# Patient Record
Sex: Male | Born: 1965 | Race: Black or African American | Hispanic: No | Marital: Single | Smoking: Never smoker
Health system: Southern US, Community
[De-identification: ages and names within clinical notes are randomized; demographics above are authoritative.]

## PROBLEM LIST (undated history)

## (undated) DIAGNOSIS — M109 Gout, unspecified: Secondary | ICD-10-CM

---

## 2016-03-27 ENCOUNTER — Emergency Department (HOSPITAL_COMMUNITY)
Admission: EM | Admit: 2016-03-27 | Discharge: 2016-03-27 | Disposition: A | Discharge status: Home / Self Care | Payer: Federal, State, Local not specified - PPO | Attending: Emergency Medicine | Admitting: Emergency Medicine

## 2016-03-27 ENCOUNTER — Emergency Department (HOSPITAL_COMMUNITY): Payer: Federal, State, Local not specified - PPO

## 2016-03-27 ENCOUNTER — Encounter (HOSPITAL_COMMUNITY): Payer: Self-pay | Admitting: Emergency Medicine

## 2016-03-27 DIAGNOSIS — Z79899 Other long term (current) drug therapy: Secondary | ICD-10-CM | POA: Diagnosis not present

## 2016-03-27 DIAGNOSIS — M7989 Other specified soft tissue disorders: Secondary | ICD-10-CM | POA: Diagnosis present

## 2016-03-27 DIAGNOSIS — M109 Gout, unspecified: Secondary | ICD-10-CM

## 2016-03-27 LAB — CBG MONITORING, ED: GLUCOSE-CAPILLARY: 99 mg/dL (ref 65–99)

## 2016-03-27 MED ORDER — CEPHALEXIN 500 MG PO CAPS: 500.0000 mg | ORAL_CAPSULE | Freq: Four times a day (QID) | ORAL | 0 refills | Status: DC

## 2016-03-27 MED ORDER — COLCHICINE 0.6 MG PO TABS: 0.6000 mg | ORAL_TABLET | Freq: Every day | ORAL | 0 refills | Status: DC

## 2016-03-27 MED ORDER — IBUPROFEN 200 MG PO TABS
600.0000 mg | ORAL_TABLET | Freq: Once | ORAL | Status: AC
  Administered 2016-03-27: 600 mg via ORAL
  Filled 2016-03-27: qty 3

## 2016-03-27 MED ORDER — OXYCODONE-ACETAMINOPHEN 5-325 MG PO TABS: 1.0000 | ORAL_TABLET | ORAL | 0 refills | Status: DC | PRN

## 2016-03-27 NOTE — ED Provider Notes (Signed)
[facility]-EMERGENCY DEPT Provider Note   CSN: [id number] Arrival date & time: 03/27/16  1641   By signing my name below, I, Clovis Pu, attest that this documentation has been prepared under the direction and in the presence of  [facility], PA-C. Electronically Signed: Clovis Pu, ED Scribe. 03/27/16. 5:36 PM.   History   Chief Complaint Chief Complaint  Patient presents with  . Abscess    right foot   The history is provided by the patient. No language interpreter was used.   HPI Comments:  Darvell Monteforte is a 51 y.o. male who presents to the Emergency Department complaining of acute onset swelling to his right big toe with associated "4/10" pain x 2 days. His pain is worse with palpation and movement. Pt also reports swelling and redness to his right foot. No alleviating factors noted. Pt denies fevers, chills, nausea, vomiting, leg swelling, body aches, any recent injury to his foot, any recent insect bites, a hx of gout and any other associated symptoms at this time. No PCP noted.   History reviewed. No pertinent past medical history.  There are no active problems to display for this patient.   History reviewed. No pertinent surgical history.   Home Medications    Prior to Admission medications   Medication Sig Start Date End Date Taking? Authorizing Provider  cephALEXin (KEFLEX) 500 MG capsule Take 1 capsule (500 mg total) by mouth 4 (four) times daily. 03/27/16   Trixie Dredge, PA-C  colchicine 0.6 MG tablet Take 1 tablet (0.6 mg total) by mouth daily. Take 1.2 mg PO once, then take 0.6mg  tab 1 hour later. 03/27/16   Trixie Dredge, PA-C  oxyCODONE-acetaminophen (PERCOCET/ROXICET) 5-325 MG tablet Take 1-2 tablets by mouth every 4 (four) hours as needed for moderate pain or severe pain. 03/27/16   Trixie Dredge, PA-C    Family History No family history on file.  Social History Social History  Substance Use Topics  . Smoking status: Never Smoker  . Smokeless tobacco: Never Used  .  Alcohol use No     Allergies   Patient has no allergy information on record.   Review of Systems Review of Systems  Constitutional: Negative for chills and fever.  Cardiovascular: Negative for leg swelling.  Gastrointestinal: Negative for nausea and vomiting.  Musculoskeletal: Positive for joint swelling. Negative for myalgias.  Skin: Positive for color change.   Physical Exam Updated Vital Signs BP 118/85   Pulse 79   Temp 98.2 F (36.8 C) (Oral)   Resp 17   SpO2 98%   Physical Exam  Constitutional: He appears well-developed and well-nourished.  HENT:  Head: Normocephalic and atraumatic.  Neck: Neck supple.  Pulmonary/Chest: Effort normal.  Musculoskeletal: He exhibits tenderness. He exhibits no edema.  Right foot: Diffuse edema and erythema overlying dorsum not including the ankle. TTP only over the 1st MTP. Slight decrease of ROM of the 1st toe. Capillary refill < 3 seconds. Sensation intact.   Neurological: He is alert.  Nursing note and vitals reviewed.      ED Treatments / Results  DIAGNOSTIC STUDIES:  Oxygen Saturation is 99% on RA, normal by my interpretation.    COORDINATION OF CARE:  5:33 PM Discussed treatment plan with pt at bedside and pt agreed to plan.  Labs (all labs ordered are listed, but only abnormal results are displayed) Labs Reviewed  CBG MONITORING, ED    EKG  EKG Interpretation None       Radiology Dg Foot Complete  Right  Result Date: 03/27/2016 CLINICAL DATA:  First toe with pain and swelling for 3 days, no known injury, initial encounter EXAM: RIGHT FOOT COMPLETE - 3+ VIEW COMPARISON:  None. FINDINGS: Mild degenerative changes are noted at the first MTP joint. No acute fracture or dislocation is noted. No gross soft tissue abnormality is seen. IMPRESSION: No acute abnormality noted. Electronically Signed   By: Alcide CleverMark  Lukens M.D.   On: 03/27/2016 18:03    Procedures Procedures (including critical care time)  Medications  Ordered in ED Medications  ibuprofen (ADVIL,MOTRIN) tablet 600 mg (600 mg Oral Given 03/27/16 1845)     Initial Impression / Assessment and Plan / ED Course  I have reviewed the triage vital signs and the nursing notes.  Pertinent labs & imaging results that were available during my care of the patient were reviewed by me and considered in my medical decision making (see chart for details).     Afebrile, nontoxic patient with right 1st MTP pain and associated edema and erythema of the foot.  Strongly suspect gout clinically.  Less likely cellulitis or septic joint. There is no fever no pain or tenderness beyond the 1st MTP though there is erythema.  Discussed differential and return precautions with patient, pt agrees to 2 day recheck in ED.   D/C home with colchicine, percocet, keflex.  Discussed result, findings, treatment, and follow up  with patient.  Pt given return precautions.  Pt verbalizes understanding and agrees with plan.       Final Clinical Impressions(s) / ED Diagnoses   Final diagnoses:  Acute gout of right foot, unspecified cause    New Prescriptions Discharge Medication List as of 03/27/2016  6:52 PM    START taking these medications   Details  cephALEXin (KEFLEX) 500 MG capsule Take 1 capsule (500 mg total) by mouth 4 (four) times daily., Starting Mon 03/27/2016, Print    colchicine 0.6 MG tablet Take 1 tablet (0.6 mg total) by mouth daily. Take 1.2 mg PO once, then take 0.6mg  tab 1 hour later., Starting Mon 03/27/2016, Print    oxyCODONE-acetaminophen (PERCOCET/ROXICET) 5-325 MG tablet Take 1-2 tablets by mouth every 4 (four) hours as needed for moderate pain or severe pain., Starting Mon 03/27/2016, Print        I personally performed the services described in this documentation, which was scribed in my presence. The recorded information has been reviewed and is accurate.     Trixie Dredgemily Deanne Bedgood, PA-C 03/27/16 1950    Trixie Dredgemily Trenice Mesa, PA-C 03/27/16 1951    Derwood KaplanAnkit  Nanavati, MD 03/28/16 [phone]

## 2016-03-27 NOTE — ED Notes (Signed)
Ortho at bedside.

## 2016-03-27 NOTE — ED Triage Notes (Signed)
Pt reports abscess on right big toe that needs to be drained.

## 2016-03-27 NOTE — Discharge Instructions (Signed)
Read the information below.  Use the prescribed medication as directed.  Please discuss all new medications with your pharmacist.  Do not take additional tylenol while taking the prescribed pain medication to avoid overdose.  You may return to the Emergency Department at any time for worsening condition or any new symptoms that concern you.     If you develop increased redness, swelling, pus draining from the wound, uncontrolled pain, or fevers greater than 100.4, return to the ER immediately for a recheck.   °

## 2016-03-29 ENCOUNTER — Emergency Department (HOSPITAL_COMMUNITY)
Admission: EM | Admit: 2016-03-29 | Discharge: 2016-03-29 | Disposition: A | Discharge status: Home / Self Care | Payer: Federal, State, Local not specified - PPO | Attending: Emergency Medicine | Admitting: Emergency Medicine

## 2016-03-29 ENCOUNTER — Encounter (HOSPITAL_COMMUNITY): Payer: Self-pay | Admitting: Emergency Medicine

## 2016-03-29 DIAGNOSIS — M109 Gout, unspecified: Secondary | ICD-10-CM

## 2016-03-29 DIAGNOSIS — Z09 Encounter for follow-up examination after completed treatment for conditions other than malignant neoplasm: Secondary | ICD-10-CM

## 2016-03-29 HISTORY — DX: Gout, unspecified: M10.9

## 2016-03-29 NOTE — ED Triage Notes (Signed)
Pt was recently seen and treated for gout (given antibiotics).  He was told to come back and follow up to make sure the medication was working.  States he thinks the medication is working and he feels a lot better.

## 2016-03-29 NOTE — Discharge Instructions (Signed)
Return here as needed.  Follow up with a primary care doctor °

## 2016-03-29 NOTE — ED Provider Notes (Signed)
[facility]ER[facility] DEPT Provider Note   CSN: [id number] Arrival date & time: 03/29/16  1855     History   Chief Complaint Chief Complaint  Patient presents with  . Follow-up    HPI Allen Eaton is a 51 y.o. male.  HPI Patient presents to the emergency department or recheck of his gout flare up.  Patient states he is told to come back here in 2 days for recheck.  The patient states he just started the medicines yesterday and now has dramatic improvement.  He states the pain is 1 out of 10.  He states that he feels dramatically improved.  Patient has no other complaints at this time Past Medical History:  Diagnosis Date  . Gout     There are no active problems to display for this patient.   History reviewed. No pertinent surgical history.     Home Medications    Prior to Admission medications   Medication Sig Start Date End Date Taking? Authorizing Provider  cephALEXin (KEFLEX) 500 MG capsule Take 1 capsule (500 mg total) by mouth 4 (four) times daily. 03/27/16   Trixie DredgeEmily West, PA-C  colchicine 0.6 MG tablet Take 1 tablet (0.6 mg total) by mouth daily. Take 1.2 mg PO once, then take 0.6mg  tab 1 hour later. 03/27/16   Trixie DredgeEmily West, PA-C  oxyCODONE-acetaminophen (PERCOCET/ROXICET) 5-325 MG tablet Take 1-2 tablets by mouth every 4 (four) hours as needed for moderate pain or severe pain. 03/27/16   Trixie DredgeEmily West, PA-C    Family History No family history on file.  Social History Social History  Substance Use Topics  . Smoking status: Never Smoker  . Smokeless tobacco: Never Used  . Alcohol use No     Allergies   Patient has no known allergies.   Review of Systems Review of Systems  All other systems negative except as documented in the HPI. All pertinent positives and negatives as reviewed in the HPI. Physical Exam Updated Vital Signs BP 146/97 (BP Location: Left Arm)   Pulse 74   Temp 98 F (36.7 C) (Oral)   Resp 18   Ht 5\' 7"  (1.702 m)   Wt 73.5 kg   SpO2 98%   BMI  25.37 kg/m   Physical Exam  Constitutional: He is oriented to person, place, and time. He appears well-developed and well-nourished.  Musculoskeletal:       Feet:  Neurological: He is alert and oriented to person, place, and time.     ED Treatments / Results  Labs (all labs ordered are listed, but only abnormal results are displayed) Labs Reviewed - No data to display  EKG  EKG Interpretation None       Radiology No results found.  Procedures Procedures (including critical care time)  Medications Ordered in ED Medications - No data to display   Initial Impression / Assessment and Plan / ED Course  I have reviewed the triage vital signs and the nursing notes.  Pertinent labs & imaging results that were available during my care of the patient were reviewed by me and considered in my medical decision making (see chart for details).     Patient is advised to return here as needed.  Told to follow up with primary care doctor   Final Clinical Impressions(s) / ED Diagnoses   Final diagnoses:  None    New Prescriptions New Prescriptions   No medications on file     Charlestine NightChristopher Shanee Batch, PA-C 03/29/16 2106    Lyndal Pulleyaniel Knott, MD  03/30/16 0359  

## 2017-12-19 IMAGING — CR DG FOOT COMPLETE 3+V*R*
3 series · 3 of 3 positions shown · non-contrast
Comparison: None.

CLINICAL DATA: First toe with pain and swelling for 3 days, no
known injury, initial encounter

EXAM:
RIGHT FOOT COMPLETE - 3+ VIEW

[x foot ap right]
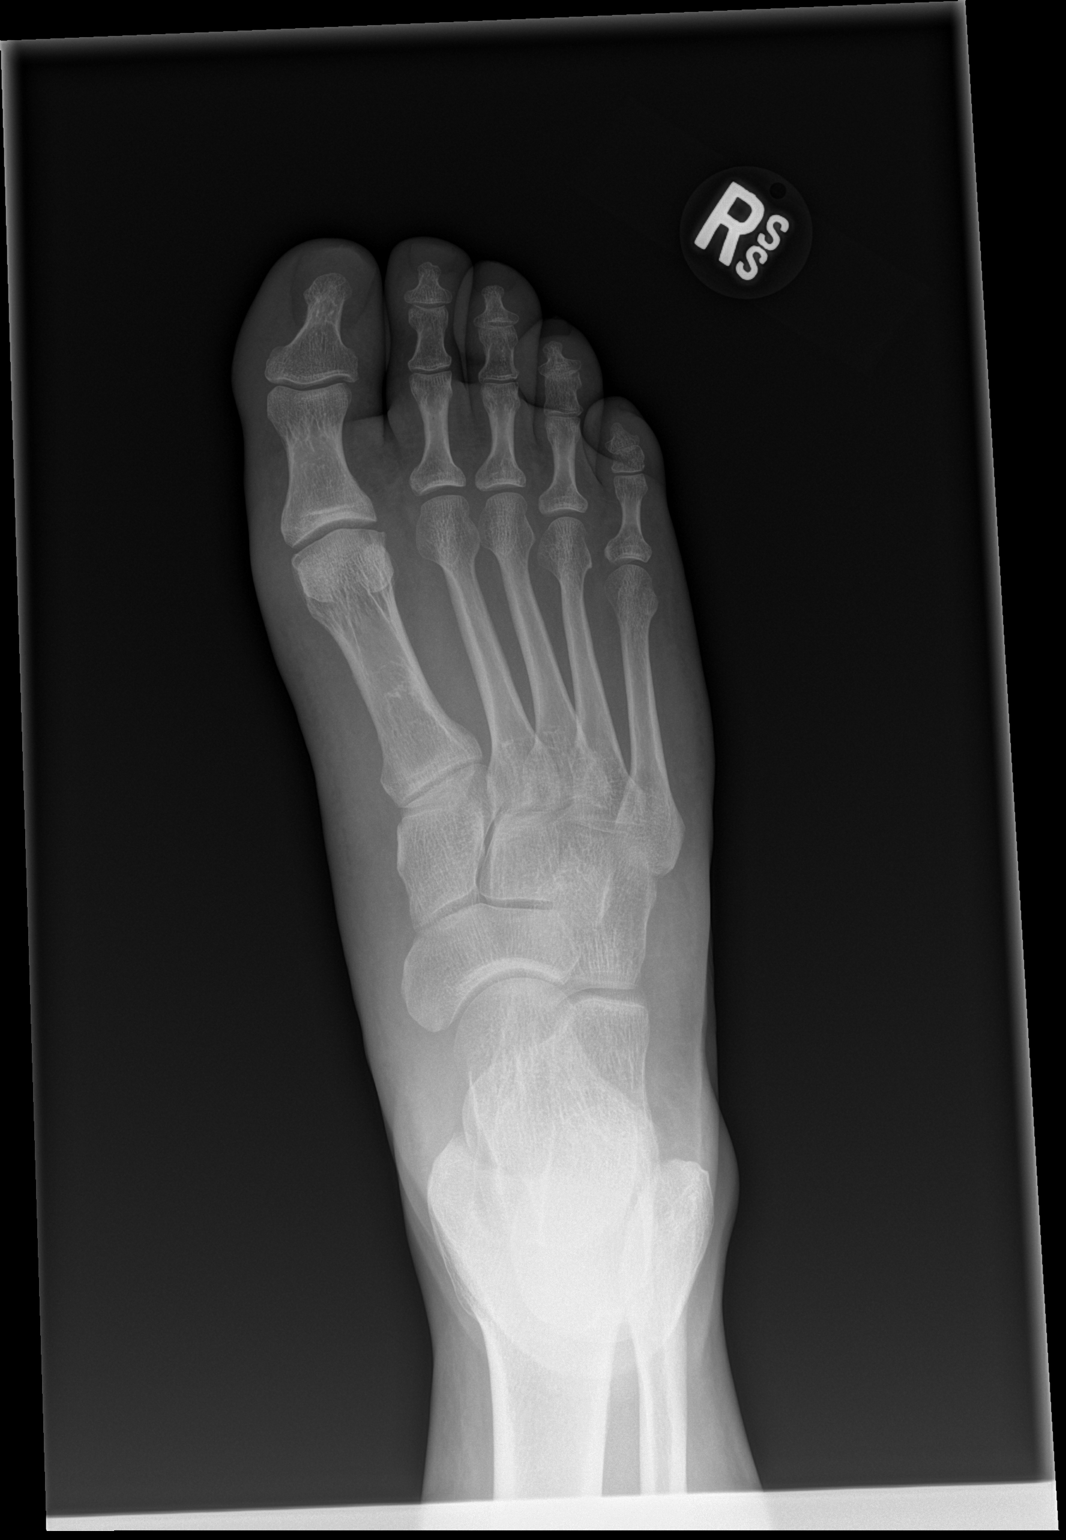

[x foot obl right]
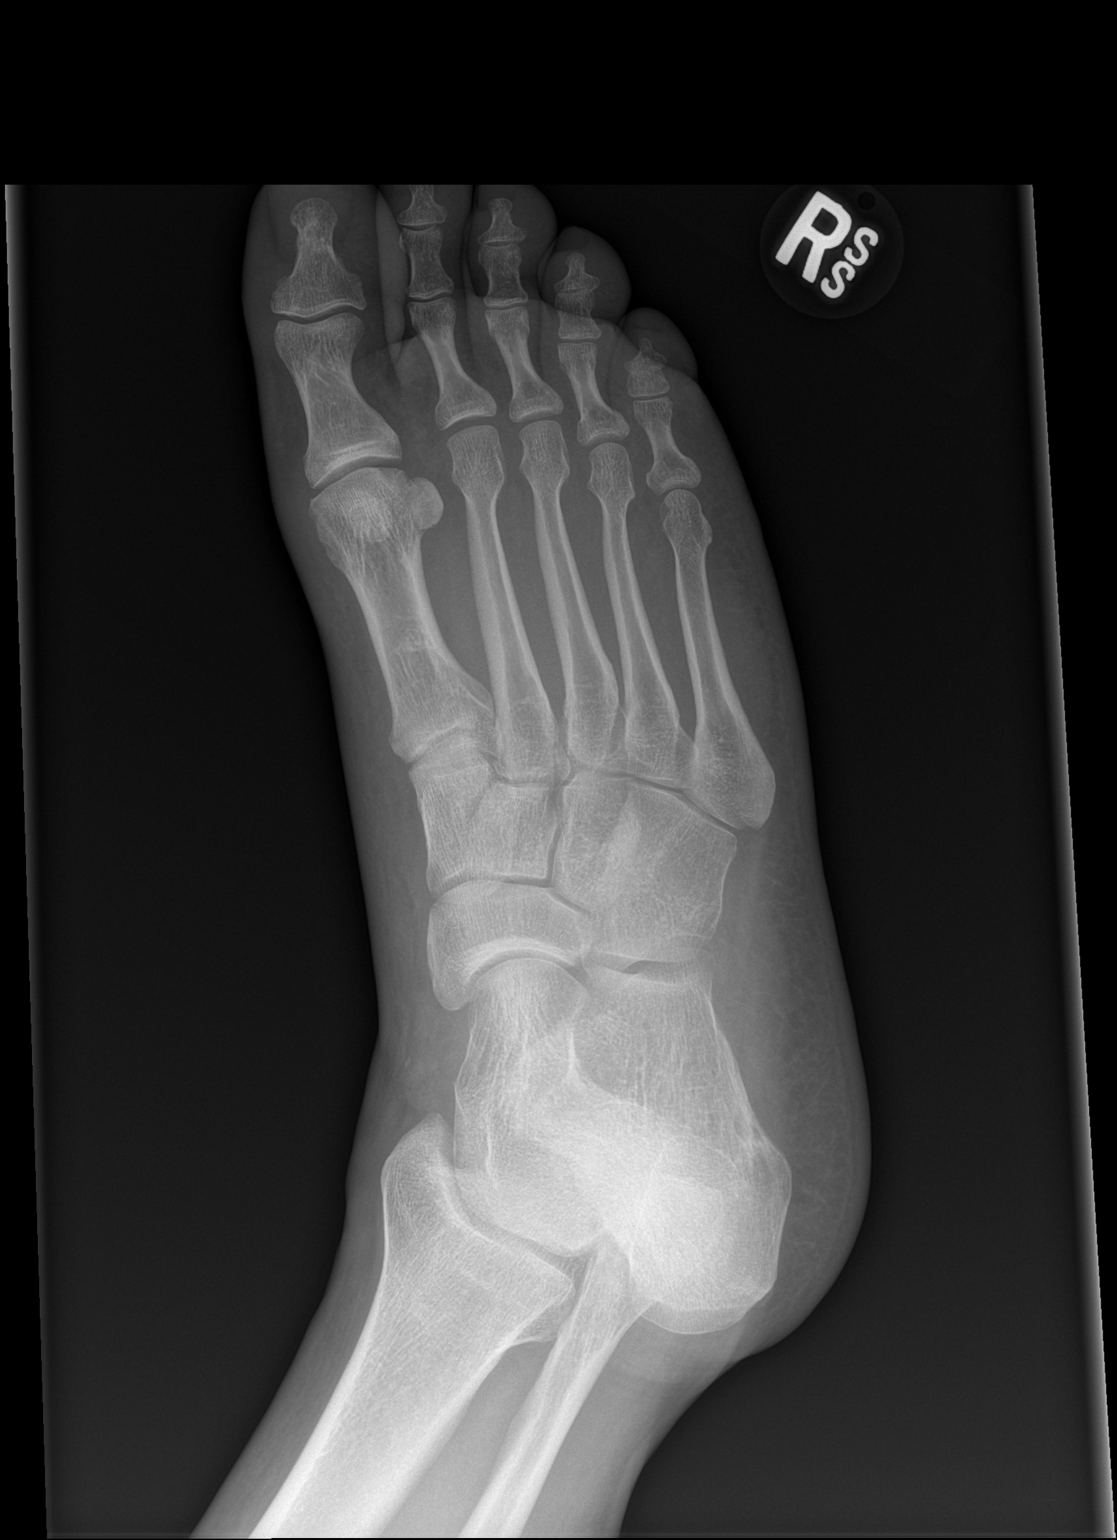

[x foot lat right]
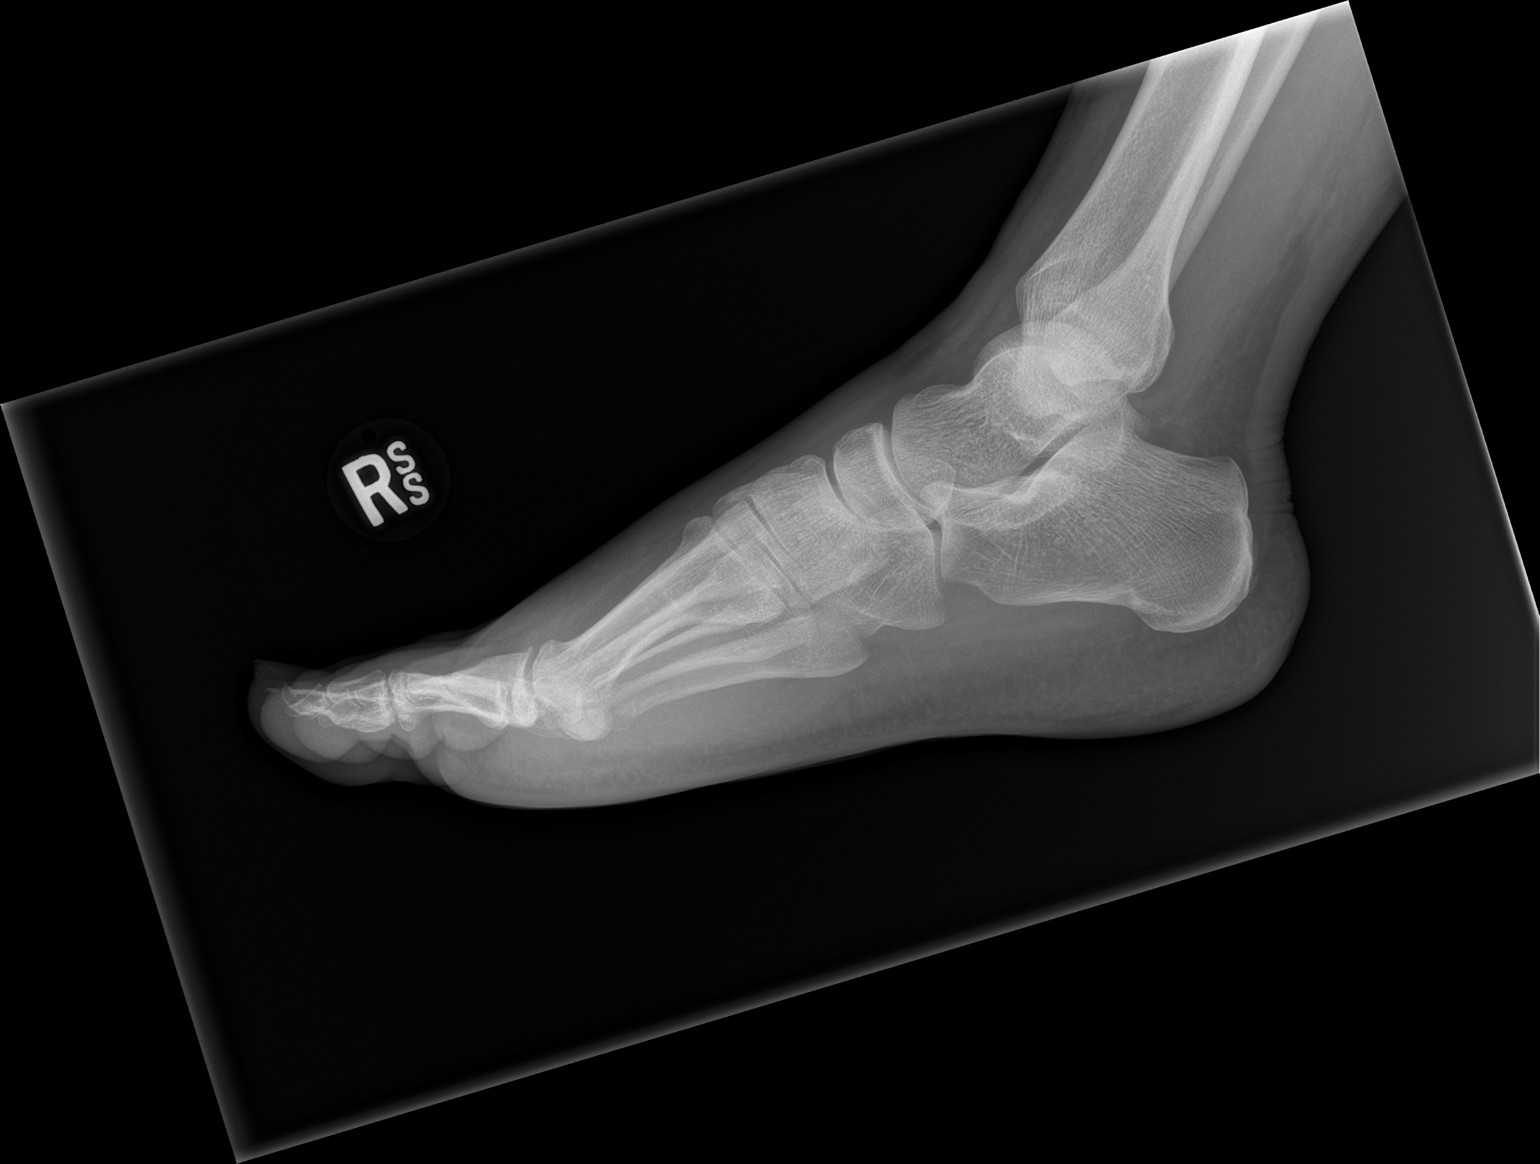

[3 of 3 positions shown; findings below may reference images not displayed]

FINDINGS: Mild degenerative changes are noted at the first MTP joint. No acute
fracture or dislocation is noted. No gross soft tissue abnormality
is seen.
IMPRESSION: No acute abnormality noted.

## 2022-02-01 ENCOUNTER — Other Ambulatory Visit: Payer: Self-pay

## 2022-02-01 ENCOUNTER — Encounter (HOSPITAL_BASED_OUTPATIENT_CLINIC_OR_DEPARTMENT_OTHER): Payer: Self-pay

## 2022-02-01 ENCOUNTER — Emergency Department (HOSPITAL_BASED_OUTPATIENT_CLINIC_OR_DEPARTMENT_OTHER)
Admission: EM | Admit: 2022-02-01 | Discharge: 2022-02-01 | Disposition: A | Discharge status: Home / Self Care | Payer: Federal, State, Local not specified - PPO | Attending: Emergency Medicine | Admitting: Emergency Medicine

## 2022-02-01 DIAGNOSIS — R55 Syncope and collapse: Secondary | ICD-10-CM

## 2022-02-01 DIAGNOSIS — N289 Disorder of kidney and ureter, unspecified: Secondary | ICD-10-CM | POA: Diagnosis not present

## 2022-02-01 DIAGNOSIS — R3121 Asymptomatic microscopic hematuria: Secondary | ICD-10-CM | POA: Diagnosis not present

## 2022-02-01 LAB — CBC WITH DIFFERENTIAL/PLATELET
Abs Immature Granulocytes: 0.05 10*3/uL (ref 0.00–0.07)
Basophils Absolute: 0 10*3/uL (ref 0.0–0.1)
Basophils Relative: 0 %
Eosinophils Absolute: 0 10*3/uL (ref 0.0–0.5)
Eosinophils Relative: 0 %
HCT: 46.7 % (ref 39.0–52.0)
Hemoglobin: 15.5 g/dL (ref 13.0–17.0)
Immature Granulocytes: 0 %
Lymphocytes Relative: 7 %
Lymphs Abs: 1.1 10*3/uL (ref 0.7–4.0)
MCH: 28.1 pg (ref 26.0–34.0)
MCHC: 33.2 g/dL (ref 30.0–36.0)
MCV: 84.6 fL (ref 80.0–100.0)
Monocytes Absolute: 1 10*3/uL (ref 0.1–1.0)
Monocytes Relative: 7 %
Neutro Abs: 13.1 10*3/uL — ABNORMAL HIGH (ref 1.7–7.7)
Neutrophils Relative %: 86 %
Platelets: 232 10*3/uL (ref 150–400)
RBC: 5.52 MIL/uL (ref 4.22–5.81)
RDW: 14.1 % (ref 11.5–15.5)
WBC: 15.3 10*3/uL — ABNORMAL HIGH (ref 4.0–10.5)
nRBC: 0 % (ref 0.0–0.2)

## 2022-02-01 LAB — URINALYSIS, ROUTINE W REFLEX MICROSCOPIC
Bilirubin Urine: NEGATIVE
Glucose, UA: NEGATIVE mg/dL
Ketones, ur: NEGATIVE mg/dL
Leukocytes,Ua: NEGATIVE
Nitrite: NEGATIVE
Protein, ur: 30 mg/dL — AB
Specific Gravity, Urine: 1.025 (ref 1.005–1.030)
pH: 5 (ref 5.0–8.0)

## 2022-02-01 LAB — BASIC METABOLIC PANEL
Anion gap: 15 (ref 5–15)
BUN: 30 mg/dL — ABNORMAL HIGH (ref 6–20)
CO2: 22 mmol/L (ref 22–32)
Calcium: 9.8 mg/dL (ref 8.9–10.3)
Chloride: 100 mmol/L (ref 98–111)
Creatinine, Ser: 2.53 mg/dL — ABNORMAL HIGH (ref 0.61–1.24)
GFR, Estimated: 29 mL/min — ABNORMAL LOW (ref >60)
Glucose, Bld: 94 mg/dL (ref 70–99)
Potassium: 5.3 mmol/L — ABNORMAL HIGH (ref 3.5–5.1)
Sodium: 137 mmol/L (ref 135–145)

## 2022-02-01 LAB — CBG MONITORING, ED: Glucose-Capillary: 80 mg/dL (ref 70–99)

## 2022-02-01 MED ORDER — SODIUM CHLORIDE 0.9 % IV BOLUS
1000.0000 mL | Freq: Once | INTRAVENOUS | Status: AC
  Administered 2022-02-01: 1000 mL via INTRAVENOUS

## 2022-02-01 NOTE — ED Triage Notes (Signed)
Pt. 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

## 2022-02-01 NOTE — ED Provider Notes (Signed)
[facility] Provider Note: Lowella Dell, MD, FACEP  CSN: [id number] MRN: [id number] ARRIVAL: 02/01/22 at 0523 ROOM: DB014/DB014   CHIEF COMPLAINT  Syncope   HISTORY OF PRESENT ILLNESS  02/01/22 5:43 AM Allen Eaton is a 56 y.o. 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  He was standing when this happened.  There was no tonic-clonic seizure activity.  He slid to the ground and did not injure himself.  EMS did respond to the scene and did an EKG (which showed an incomplete right bundle branch block) and a glucose but he chose to have family members bring him to the ED.  He feels back to normal now.  He admits that he does not drink enough fluid or eat regular meals and suspects this may have contributed to what happened.  He denies any palpitations, chest pain or shortness of breath at any time.  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   Past Medical History:  Diagnosis Date   Gout     History reviewed. No pertinent surgical history.  History reviewed. No pertinent family history.  Social History   Tobacco Use   Smoking status: Never   Smokeless tobacco: Never  Substance Use Topics   Alcohol use: No   Drug use: No    Prior to Admission medications   Not on File    Allergies Patient has no known allergies.   REVIEW OF SYSTEMS  Negative except as noted here or in the History of Present Illness.   PHYSICAL EXAMINATION  Initial Vital Signs Blood pressure (!) 124/111, pulse 96, temperature 97.9 F (36.6 C), temperature source Oral, resp. rate 18, height 5\' 7"  (1.702 m), weight 72.6 kg, SpO2 100 %.  Examination General: Well-developed, well-nourished male in no acute distress; appearance consistent with age of record HENT: normocephalic; atraumatic Eyes: pupils equal, round and reactive to light; extraocular muscles intact; arcus senilis  bilaterally; mild left ptosis Neck: supple Heart: regular rate and rhythm Lungs: clear to auscultation bilaterally Abdomen: soft; nondistended; nontender; bowel sounds present GU: Dark urine Extremities: No deformity; full range of motion; pulses normal Neurologic: Awake, alert and oriented; motor function intact in all extremities and symmetric; no facial droop Skin: Warm and dry Psychiatric: Normal mood and affect   RESULTS  Summary of this visit's results, reviewed and interpreted by myself:   EKG Interpretation  Date/Time:  Wednesday February 01 2022 05:43:26 EST Ventricular Rate:  89 PR Interval:  146 QRS Duration: 116 QT Interval:  368 QTC Calculation: 448 R Axis:   182 Text Interpretation: Sinus rhythm Right atrial enlargement IRBBB and LPFB No previous ECGs available Confirmed by Benito Lemmerman, 10-11-1992 (Jonny Ruiz) on 02/01/2022 5:47:20 AM       Laboratory Studies: Results for orders placed or performed during the hospital encounter of 02/01/22 (from the past 24 hour(s))  CBG monitoring, ED     Status: None   Collection Time: 02/01/22  5:34 AM  Result Value Ref Range   Glucose-Capillary 80 70 - 99 mg/dL  Urinalysis, Routine w reflex microscopic Urine, Clean Catch     Status: Abnormal   Collection Time: 02/01/22  5:40 AM  Result Value Ref Range   Color, Urine YELLOW YELLOW   APPearance HAZY (A) CLEAR   Specific Gravity, Urine 1.025 1.005 - 1.030   pH 5.0 5.0 - 8.0   Glucose, UA  NEGATIVE NEGATIVE mg/dL   Hgb urine dipstick SMALL (A) NEGATIVE   Bilirubin Urine NEGATIVE NEGATIVE   Ketones, ur NEGATIVE NEGATIVE mg/dL   Protein, ur 30 (A) NEGATIVE mg/dL   Nitrite NEGATIVE NEGATIVE   Leukocytes,Ua NEGATIVE NEGATIVE   RBC / HPF 21-50 0 - 5 RBC/hpf   WBC, UA 6-10 0 - 5 WBC/hpf   Bacteria, UA RARE (A) NONE SEEN   Squamous Epithelial / LPF 0-5 0 - 5   Mucus PRESENT    Hyaline Casts, UA PRESENT    Granular Casts, UA PRESENT   CBC with Differential     Status: Abnormal    Collection Time: 02/01/22  5:40 AM  Result Value Ref Range   WBC 15.3 (H) 4.0 - 10.5 K/uL   RBC 5.52 4.22 - 5.81 MIL/uL   Hemoglobin 15.5 13.0 - 17.0 g/dL   HCT 65.7 84.6 - 96.2 %   MCV 84.6 80.0 - 100.0 fL   MCH 28.1 26.0 - 34.0 pg   MCHC 33.2 30.0 - 36.0 g/dL   RDW 95.2 84.1 - 32.4 %   Platelets 232 150 - 400 K/uL   nRBC 0.0 0.0 - 0.2 %   Neutrophils Relative % 86 %   Neutro Abs 13.1 (H) 1.7 - 7.7 K/uL   Lymphocytes Relative 7 %   Lymphs Abs 1.1 0.7 - 4.0 K/uL   Monocytes Relative 7 %   Monocytes Absolute 1.0 0.1 - 1.0 K/uL   Eosinophils Relative 0 %   Eosinophils Absolute 0.0 0.0 - 0.5 K/uL   Basophils Relative 0 %   Basophils Absolute 0.0 0.0 - 0.1 K/uL   Immature Granulocytes 0 %   Abs Immature Granulocytes 0.05 0.00 - 0.07 K/uL  Basic metabolic panel     Status: Abnormal   Collection Time: 02/01/22  5:40 AM  Result Value Ref Range   Sodium 137 135 - 145 mmol/L   Potassium 5.3 (H) 3.5 - 5.1 mmol/L   Chloride 100 98 - 111 mmol/L   CO2 22 22 - 32 mmol/L   Glucose, Bld 94 70 - 99 mg/dL   BUN 30 (H) 6 - 20 mg/dL   Creatinine, Ser 4.01 (H) 0.61 - 1.24 mg/dL   Calcium 9.8 8.9 - 02.7 mg/dL   GFR, Estimated 29 (L) >60 mL/min   Anion gap 15 5 - 15   Imaging Studies: No results found.  ED COURSE and MDM  Nursing notes, initial and subsequent vitals signs, including pulse oximetry, reviewed and interpreted by myself.  Vitals:   02/01/22 0531 02/01/22 0533 02/01/22 0630  BP: (!) 124/111  129/81  Pulse: 96  96  Resp: 18  20  Temp: 97.9 F (36.6 C)    TempSrc: Oral    SpO2: 100%  96%  Weight:  72.6 kg   Height:  5\' 7"  (1.702 m)    Medications  sodium chloride 0.9 % bolus 1,000 mL (1,000 mLs Intravenous New Bag/Given 02/01/22 0547)   6:14 AM Patient's urinalysis is consistent with mild dehydration, likely the cause of his syncope.  He has had no arrhythmia or ectopic beats on his rhythm strip.  His EKG shows an incomplete right bundle branch block but we have no old  EKG with which to compare.  It also shows microscopic hematuria and a small amount of pyuria.  He is having no dysuria or flank pain.  This may be related to his obvious renal insufficiency (BUN 30, creatinine 2.53).  He does not have a  primary care physician.  We will refer him to [facility] and he was advised of the importance of follow-up as we wish to avoid further damage to his kidneys and certainly wish to avoid dialysis.  His family members who accompany him states they will see that he gets follow-up as recommended.  He was also advised to find a primary care physician as well.  He also has a leukocytosis but is afebrile and denies any symptoms of an acute illness.  This is nonspecific and could be reactive to the stress of the syncopal episode.    PROCEDURES  Procedures   ED DIAGNOSES     ICD-10-CM   1. Syncope and collapse  R55     2. Asymptomatic microscopic hematuria  R31.21     3. Renal insufficiency  N28.9          Equan Cogbill, Jonny Ruiz, MD 02/01/22 [phone]

## 2022-08-01 ENCOUNTER — Other Ambulatory Visit: Payer: Self-pay | Admitting: Nephrology

## 2022-08-01 DIAGNOSIS — R7989 Other specified abnormal findings of blood chemistry: Secondary | ICD-10-CM
